# Patient Record
Sex: Female | Born: 1960 | Race: White | Hispanic: No | Marital: Single | Smoking: Never smoker
Health system: Southern US, Community
[De-identification: ages and names within clinical notes are randomized; demographics above are authoritative.]

## PROBLEM LIST (undated history)

## (undated) DIAGNOSIS — I471 Supraventricular tachycardia, unspecified: Secondary | ICD-10-CM

## (undated) HISTORY — PX: PARATHYROIDECTOMY: SHX19

## (undated) HISTORY — PX: BUNIONECTOMY: SHX129

## (undated) HISTORY — DX: Supraventricular tachycardia, unspecified: I47.10

## (undated) HISTORY — PX: NECK SURGERY: SHX720

## (undated) HISTORY — DX: Supraventricular tachycardia: I47.1

---

## 2010-07-21 ENCOUNTER — Encounter: Admission: RE | Admit: 2010-07-21 | Discharge: 2010-07-21 | Payer: Self-pay

## 2010-11-28 ENCOUNTER — Other Ambulatory Visit (HOSPITAL_COMMUNITY)
Admission: RE | Admit: 2010-11-28 | Discharge: 2010-11-28 | Disposition: A | Discharge status: Home / Self Care | Payer: BC Managed Care – PPO | Source: Ambulatory Visit | Attending: Internal Medicine | Admitting: Internal Medicine

## 2010-11-28 ENCOUNTER — Other Ambulatory Visit: Payer: Self-pay | Admitting: Internal Medicine

## 2010-11-28 DIAGNOSIS — Z01419 Encounter for gynecological examination (general) (routine) without abnormal findings: Secondary | ICD-10-CM | POA: Insufficient documentation

## 2010-12-23 ENCOUNTER — Ambulatory Visit
Admission: RE | Admit: 2010-12-23 | Discharge: 2010-12-23 | Disposition: A | Discharge status: Home / Self Care | Payer: BC Managed Care – PPO | Source: Ambulatory Visit | Attending: Internal Medicine | Admitting: Internal Medicine

## 2010-12-23 ENCOUNTER — Other Ambulatory Visit: Payer: Self-pay | Admitting: Internal Medicine

## 2010-12-23 MED ORDER — IOHEXOL 300 MG/ML  SOLN
100.0000 mL | Freq: Once | INTRAMUSCULAR | Status: AC | PRN
  Administered 2010-12-23: 100 mL via INTRAVENOUS

## 2011-04-14 ENCOUNTER — Encounter: Payer: Self-pay | Admitting: Cardiology

## 2011-05-23 ENCOUNTER — Encounter: Payer: Self-pay | Admitting: Internal Medicine

## 2011-05-23 ENCOUNTER — Ambulatory Visit (INDEPENDENT_AMBULATORY_CARE_PROVIDER_SITE_OTHER): Payer: BC Managed Care – PPO | Admitting: Internal Medicine

## 2011-05-23 VITALS — BP 94/54 | HR 68 | Resp 12 | Ht 66.0 in | Wt 134.0 lb

## 2011-05-23 DIAGNOSIS — R002 Palpitations: Secondary | ICD-10-CM

## 2011-05-23 DIAGNOSIS — I471 Supraventricular tachycardia: Secondary | ICD-10-CM

## 2011-05-23 NOTE — Assessment & Plan Note (Signed)
The patient's symptoms are a little bit unclear. The way she describes her spells coming on, I thought perhaps she had supraventricular tachycardia however she could have inappropriate sinus tachycardia. Her palpitations have improved on medical therapy.

## 2011-05-23 NOTE — Patient Instructions (Signed)
Your physician recommends that you schedule a follow-up appointment in: 3 months with Dr Taylor  

## 2011-05-23 NOTE — Assessment & Plan Note (Signed)
I have had a chance to review the patient's cardiac monitor, exercise electrocardiogram, and 2-D echo. Review of her cardiac monitor demonstrates that when she goes into tachycardia, her P-wave morphology we'll change and her PR interval actually shrink somewhat. This would not be consistent with inappropriate sinus tachycardia. More likely however would be atrial tachycardia originating from the right atrium. I have discussed the treatment options with the patient. She very clearly has improvement in her symptoms on beta blocker therapy. She does experience dizziness and lightheadedness particularly early in the morning.  My recommendation will be that she continue her beta blocker and undergo a period of watchful waiting. I reassured the patient. Because her blood pressure is low, I have asked that she increase her salt and fluid intake and avoid caffeine.

## 2011-05-23 NOTE — Progress Notes (Signed)
HPI Priscilla Miller is referred today by Dr. Jacinto Halim for evaluation of tachypalpitations and SVT. The patient is a 50 year old woman whose health has otherwise been good. Over the last several months, she has noted palpitations which have been variable in nature but will often awaken her from sleep. At other times she will experience them with exertion particularly after playing sports. She was seen by the emergency room physician in [place] and was thought to have anxiety. The patient notes that her regular monthly periods have begun to become irregular. She denies the use of any stimulants. After cardiology followup, a 2-D echo demonstrated preserved left ventricular systolic function and an exercise treadmill test demonstrated no inducible arrhythmias and no ST-T wave abnormalities. She subsequently wore a cardiac monitor. This demonstrates a long RP tachycardia. Interestingly the patient's tachycardia has a P-wave morphology which is similar to the P-wave morphology in sinus rhythm. There are however subtle differences. She has documented SVT at rates of up to 170 beats per minute. Also, the patient's tachycardia has a warming up. Where the heart rate will increase from about 100 beats per minute to nearly 170 beats per minute over the course of 10-20 seconds. This was nicely documented by her cardiac monitor. She has never had frank syncope. She was placed on a beta blocker and has had improvement of her symptoms but notes that her blood pressure has been running a little on the low side. No Known Allergies   Current Outpatient Prescriptions  Medication Sig Dispense Refill  . acebutolol (SECTRAL) 200 MG capsule Take 200 mg by mouth daily.        Marland Kitchen b complex vitamins tablet Take 1 tablet by mouth daily.        . Omega-3 Fatty Acids (FISH OIL BURP-LESS PO) Take 1 tablet by mouth daily.           No past medical history on file.  ROS:   All systems reviewed and negative except as noted in the  HPI.   No past surgical history on file.   No family history on file.   History   Social History  . Marital Status: Unknown    Spouse Name: N/A    Number of Children: N/A  . Years of Education: N/A   Occupational History  . Not on file.   Social History Main Topics  . Smoking status: Not on file  . Smokeless tobacco: Not on file  . Alcohol Use: Not on file  . Drug Use: Not on file  . Sexually Active: Not on file   Other Topics Concern  . Not on file   Social History Narrative  . No narrative on file     BP 94/54  Pulse 68  Resp 12  Ht 5\' 6"  (1.676 m)  Wt 134 lb (60.782 kg)  BMI 21.63 kg/m2  Physical Exam:  Well appearing middle-aged woman, NAD HEENT: Unremarkable Neck:  No JVD, no thyromegally Lymphatics:  No adenopathy Back:  No CVA tenderness Lungs:  Clear with no wheezes, rales, or rhonchi. HEART:  Regular rate rhythm, no murmurs, no rubs, no clicks Abd:  soft, positive bowel sounds, no organomegally, no rebound, no guarding Ext:  2 plus pulses, no edema, no cyanosis, no clubbing Skin:  No rashes no nodules Neuro:  CN II through XII intact, motor grossly intact  EKG Normal sinus rhythm  Assess/Plan:

## 2011-06-05 ENCOUNTER — Encounter: Payer: Self-pay | Admitting: Internal Medicine

## 2011-06-14 ENCOUNTER — Other Ambulatory Visit: Payer: Self-pay | Admitting: Internal Medicine

## 2011-06-14 DIAGNOSIS — Z1231 Encounter for screening mammogram for malignant neoplasm of breast: Secondary | ICD-10-CM

## 2011-08-02 ENCOUNTER — Ambulatory Visit
Admission: RE | Admit: 2011-08-02 | Discharge: 2011-08-02 | Disposition: A | Discharge status: Home / Self Care | Payer: BC Managed Care – PPO | Source: Ambulatory Visit | Attending: Internal Medicine | Admitting: Internal Medicine

## 2011-08-02 DIAGNOSIS — Z1231 Encounter for screening mammogram for malignant neoplasm of breast: Secondary | ICD-10-CM

## 2011-08-15 ENCOUNTER — Ambulatory Visit: Payer: BC Managed Care – PPO | Admitting: Internal Medicine

## 2012-03-31 IMAGING — MG MM DIGITAL SCREENING BILAT W/ CAD
5 series · 5 of 5 positions shown · non-contrast
Comparison: none

DG SCREEN MAMMOGRAM BILATERAL
Bilateral CC and MLO view(s) were taken.

DIGITAL SCREENING MAMMOGRAM WITH CAD:
The breast tissue is heterogeneously dense.  No masses or malignant type calcifications are 
identified.
Images were processed with CAD.

[R CC]
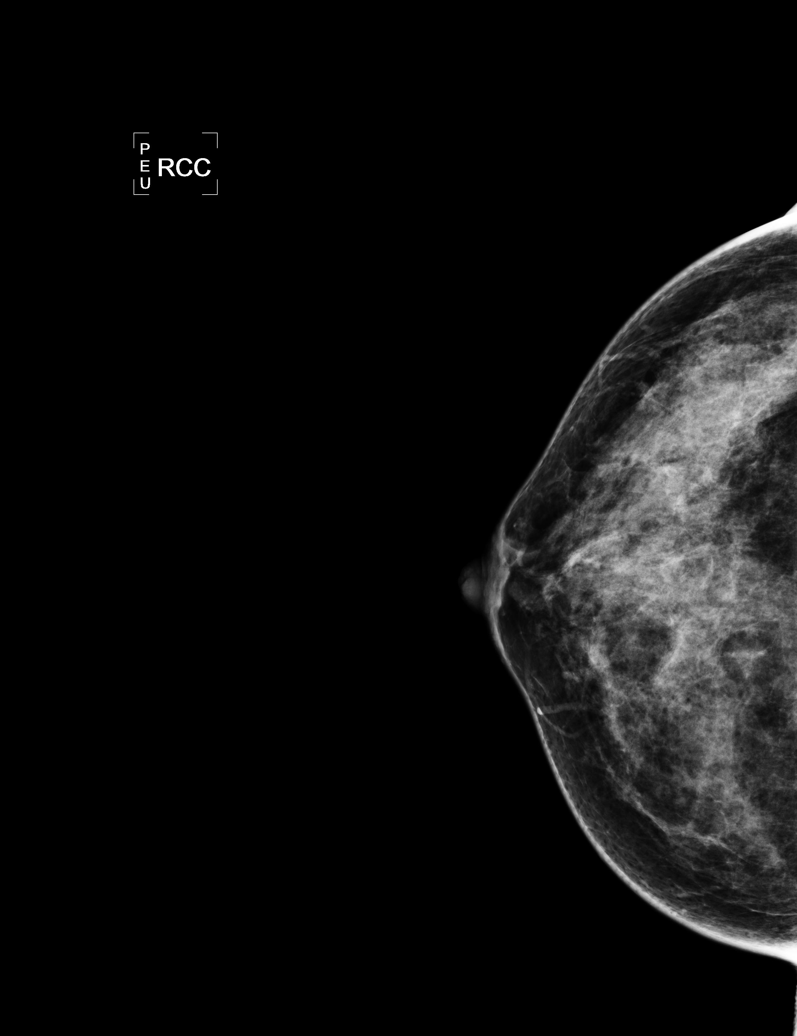

[L CC]
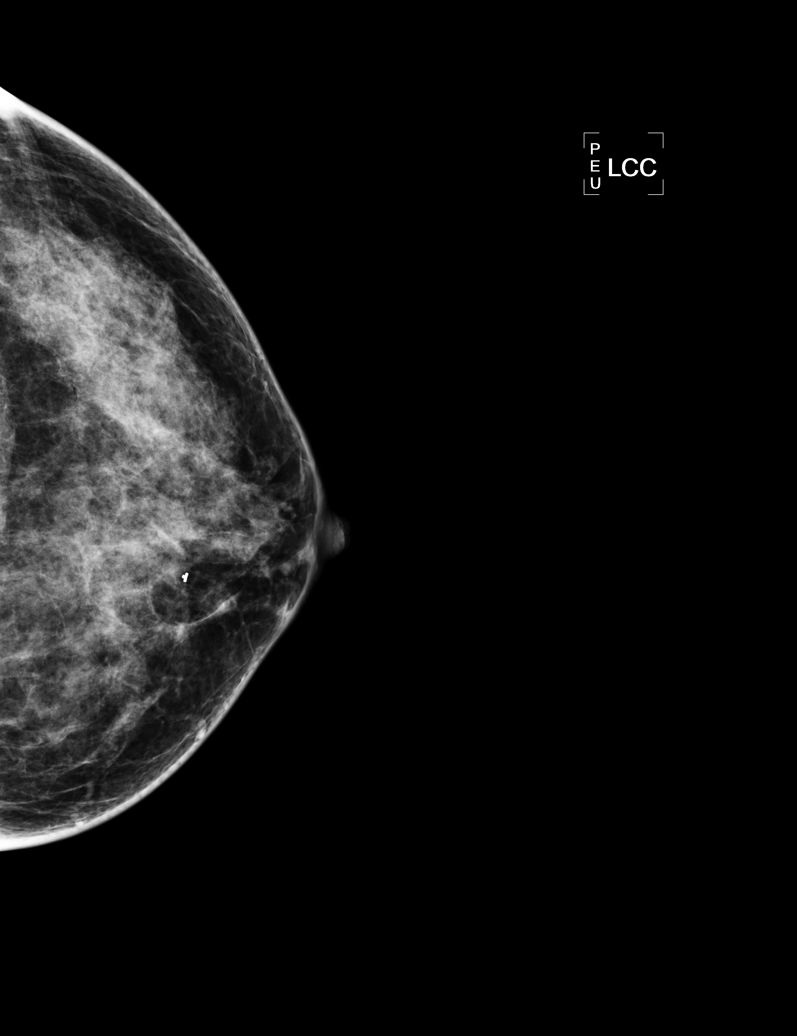

[L MLO]
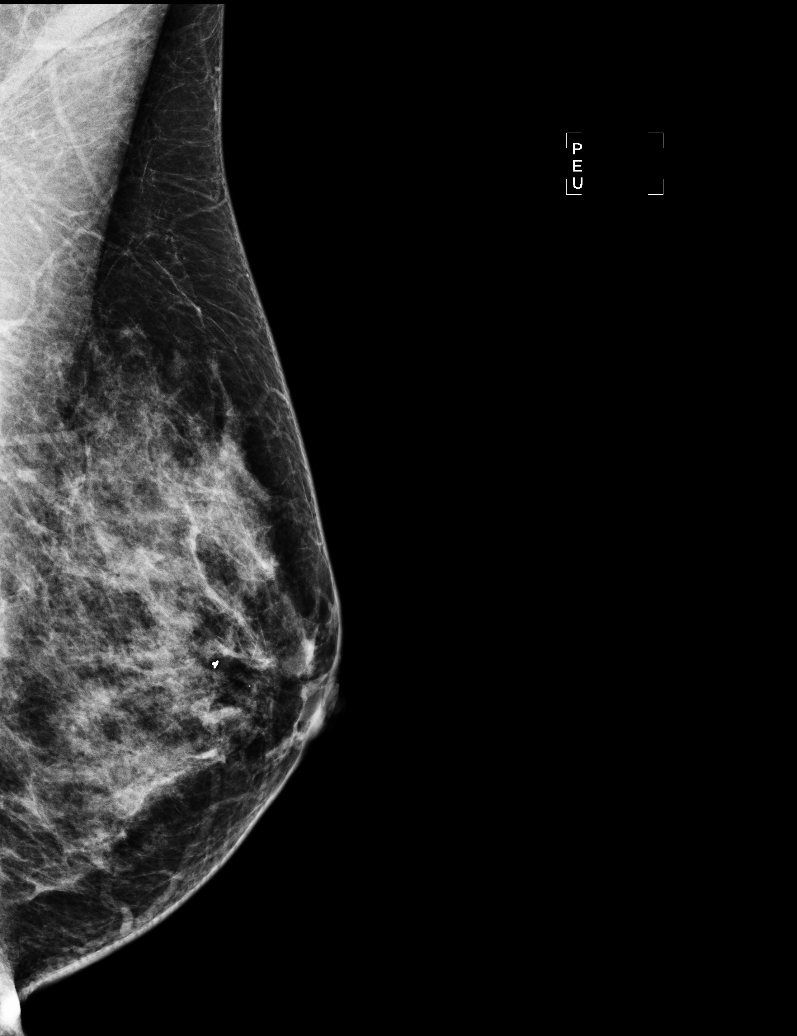

[R MLO (1 of 2)]
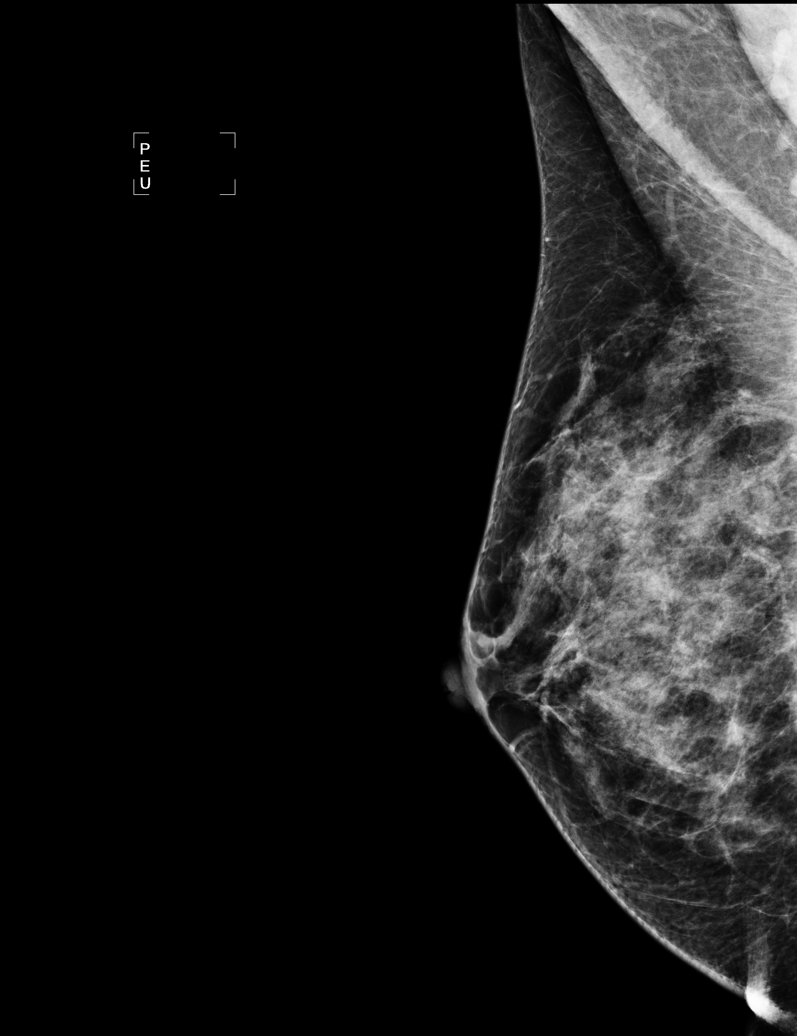

[R MLO (2 of 2)]
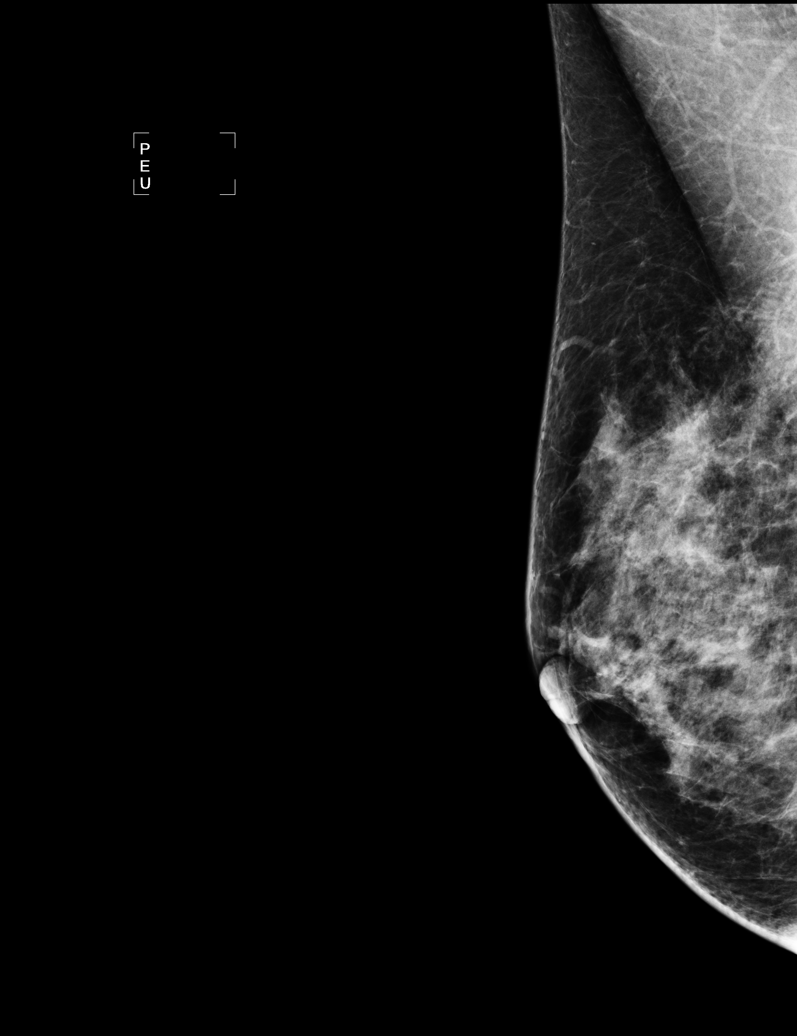

[5 of 5 positions shown; findings below may reference images not displayed]

IMPRESSION: No specific mammographic evidence of malignancy.  Next screening mammogram is recommended in one 
year.

A result letter of this screening mammogram will be mailed directly to the patient.

ASSESSMENT: Negative - BI-RADS 1

Screening mammogram in 1 year.
,

## 2012-06-25 ENCOUNTER — Other Ambulatory Visit: Payer: Self-pay | Admitting: Gynecology

## 2012-06-25 DIAGNOSIS — Z1231 Encounter for screening mammogram for malignant neoplasm of breast: Secondary | ICD-10-CM

## 2012-08-07 ENCOUNTER — Ambulatory Visit: Payer: BC Managed Care – PPO

## 2012-09-13 ENCOUNTER — Ambulatory Visit
Admission: RE | Admit: 2012-09-13 | Discharge: 2012-09-13 | Disposition: A | Discharge status: Home / Self Care | Payer: BC Managed Care – PPO | Source: Ambulatory Visit | Attending: Gynecology | Admitting: Gynecology

## 2012-09-13 DIAGNOSIS — Z1231 Encounter for screening mammogram for malignant neoplasm of breast: Secondary | ICD-10-CM

## 2013-08-12 ENCOUNTER — Other Ambulatory Visit: Payer: Self-pay | Admitting: Gynecology

## 2013-12-15 ENCOUNTER — Encounter: Payer: Self-pay | Admitting: Internal Medicine

## 2013-12-29 ENCOUNTER — Telehealth: Payer: Self-pay | Admitting: Internal Medicine

## 2013-12-29 NOTE — Telephone Encounter (Signed)
Spoke with patient. Patient concerned about being awake during procedure. She states that she wants to be "out". Explained deep sedation/propofol. Also patient concerned about her hx of heart palpitation/SVT since 2012, patient states she is going threw menopause and is not being treated for heart at this time.  Encouraged patient to talk with CRNA about this. Patient verbalizes understanding and had no further questions/concerns at this time.

## 2014-01-29 ENCOUNTER — Ambulatory Visit (AMBULATORY_SURGERY_CENTER): Payer: Self-pay

## 2014-01-29 VITALS — Ht 65.0 in | Wt 137.0 lb

## 2014-01-29 DIAGNOSIS — Z1211 Encounter for screening for malignant neoplasm of colon: Secondary | ICD-10-CM

## 2014-01-29 MED ORDER — MOVIPREP 100 G PO SOLR: 1.0000 | Freq: Once | ORAL | Status: DC

## 2014-01-29 NOTE — Progress Notes (Signed)
No allergies to eggs or soy No home oxygen No diet/weight loss medicine No past problems with anesthesia Has email  Emmi instructions given for colonoscopy

## 2014-02-02 ENCOUNTER — Encounter: Payer: Self-pay | Admitting: Internal Medicine

## 2014-02-13 ENCOUNTER — Ambulatory Visit (AMBULATORY_SURGERY_CENTER): Payer: BC Managed Care – PPO | Admitting: Internal Medicine

## 2014-02-13 ENCOUNTER — Encounter: Payer: Self-pay | Admitting: Internal Medicine

## 2014-02-13 VITALS — BP 110/70 | HR 69 | Temp 97.7°F | Resp 17 | Ht 65.0 in | Wt 137.0 lb

## 2014-02-13 DIAGNOSIS — Z1211 Encounter for screening for malignant neoplasm of colon: Secondary | ICD-10-CM

## 2014-02-13 DIAGNOSIS — D126 Benign neoplasm of colon, unspecified: Secondary | ICD-10-CM

## 2014-02-13 MED ORDER — SODIUM CHLORIDE 0.9 % IV SOLN: 500.0000 mL | INTRAVENOUS | Status: DC

## 2014-02-13 NOTE — Progress Notes (Signed)
Called to room to assist during endoscopic procedure.  Patient ID and intended procedure confirmed with present staff. Received instructions for my participation in the procedure from the performing physician.  

## 2014-02-13 NOTE — Progress Notes (Signed)
Pt anxious and tearful in the admitting area

## 2014-02-13 NOTE — Progress Notes (Signed)
A/ox3 pleased with MAC, report to [facility]

## 2014-02-13 NOTE — Op Note (Signed)
[place]  [address] [place], [address]   COLONOSCOPY PROCEDURE REPORT  PATIENT: Priscilla Miller, Priscilla Miller  MR#: [id number] BIRTHDATE: 03/29/1961 , 53  yrs. old GENDER: Female ENDOSCOPIST: Lafayette Dragon, MD REFERRED WK:[id number] Ashby Dawes, M.D. PROCEDURE DATE:  02/13/2014 PROCEDURE:   Colonoscopy with cold biopsy polypectomy First Screening Colonoscopy - Avg.  risk and is 50 yrs.  old or older Yes.  Prior Negative Screening - Now for repeat screening. N/A  History of Adenoma - Now for follow-up colonoscopy & has been > or = to 3 yrs.  N/A  Polyps Removed Today? Yes. ASA CLASS:   Class I INDICATIONS:Average risk patient for colon cancer. MEDICATIONS: MAC sedation, administered by CRNA and propofol (Diprivan) 300mg  IV  DESCRIPTION OF PROCEDURE:   After the risks benefits and alternatives of the procedure were thoroughly explained, informed consent was obtained.  A digital rectal exam revealed no abnormalities of the rectum.   The LB [id number]  endoscope was introduced through the anus and advanced to the cecum, which was identified by both the appendix and ileocecal valve. No adverse events experienced.   The quality of the prep was excellent, using MoviPrep  The instrument was then slowly withdrawn as the colon was fully examined.      COLON FINDINGS: A sessile polyp was found in the ascending colon.  A polypectomy was performed with cold forceps.  The resection was complete and the polyp tissue was completely retrieved. Retroflexed views revealed no abnormalities. The time to cecum=4 minutes 420 seconds.  Withdrawal time=6 minutes 10 seconds.  The scope was withdrawn and the procedure completed. COMPLICATIONS: There were no complications.  ENDOSCOPIC IMPRESSION: Sessile polyp was found in the ascending colon; polypectomy was performed with cold forceps  RECOMMENDATIONS: 1.  Await pathology results 2.  high fiber diet Recall colonoscopy pending path  report   eSigned:  Lafayette Dragon, MD 02/13/2014 11:28 AM   cc:

## 2014-02-13 NOTE — Patient Instructions (Signed)
YOU HAD AN ENDOSCOPIC PROCEDURE TODAY AT THE [facility]: Refer to the procedure report that was given to you for any specific questions about what was found during the examination.  If the procedure report does not answer your questions, please call your gastroenterologist to clarify.  If you requested that your care partner not be given the details of your procedure findings, then the procedure report has been included in a sealed envelope for you to review at your convenience later.  YOU SHOULD EXPECT: Some feelings of bloating in the abdomen. Passage of more gas than usual.  Walking can help get rid of the air that was put into your GI tract during the procedure and reduce the bloating. If you had a lower endoscopy (such as a colonoscopy or flexible sigmoidoscopy) you may notice spotting of blood in your stool or on the toilet paper. If you underwent a bowel prep for your procedure, then you may not have a normal bowel movement for a few days.  DIET: Your first meal following the procedure should be a light meal and then it is ok to progress to your normal diet.  A half-sandwich or bowl of soup is an example of a good first meal.  Heavy or fried foods are harder to digest and may make you feel nauseous or bloated.  Likewise meals heavy in dairy and vegetables can cause extra gas to form and this can also increase the bloating.  Drink plenty of fluids but you should avoid alcoholic beverages for 24 hours.  ACTIVITY: Your care partner should take you home directly after the procedure.  You should plan to take it easy, moving slowly for the rest of the day.  You can resume normal activity the day after the procedure however you should NOT DRIVE or use heavy machinery for 24 hours (because of the sedation medicines used during the test).    SYMPTOMS TO REPORT IMMEDIATELY: A gastroenterologist can be reached at any hour.  During normal business hours, 8:30 AM to 5:00 PM Monday through Friday,  call [phone].  After hours and on weekends, please call the GI answering service at [phone] who will take a message and have the physician on call contact you.   Following lower endoscopy (colonoscopy or flexible sigmoidoscopy):  Excessive amounts of blood in the stool  Significant tenderness or worsening of abdominal pains  Swelling of the abdomen that is new, acute  Fever of 100F or higher  FOLLOW UP: If any biopsies were taken you will be contacted by phone or by letter within the next 1-3 weeks.  Call your gastroenterologist if you have not heard about the biopsies in 3 weeks.  Our staff will call the home number listed on your records the next business day following your procedure to check on you and address any questions or concerns that you may have at that time regarding the information given to you following your procedure. This is a courtesy call and so if there is no answer at the home number and we have not heard from you through the emergency physician on call, we will assume that you have returned to your regular daily activities without incident.  SIGNATURES/CONFIDENTIALITY: You and/or your care partner have signed paperwork which will be entered into your electronic medical record.  These signatures attest to the fact that that the information above on your After Visit Summary has been reviewed and is understood.  Full responsibility of the confidentiality of this   discharge information lies with you and/or your care-partner.  Polyp and high fiber diet information given.

## 2014-02-14 ENCOUNTER — Telehealth: Payer: Self-pay | Admitting: Gastroenterology

## 2014-02-14 NOTE — Telephone Encounter (Signed)
Developed mild but persistant RLQ pain today. Having BMs, no temp. Instructed to call back if pain worsens.

## 2014-02-16 ENCOUNTER — Telehealth: Payer: Self-pay | Admitting: *Deleted

## 2014-02-16 NOTE — Telephone Encounter (Signed)
  Follow up Call-  Call back number 02/13/2014  Post procedure Call Back phone  # [phone]  Permission to leave phone message Yes     Patient questions:  Left message to call us if necessary.

## 2014-02-17 ENCOUNTER — Encounter: Payer: Self-pay | Admitting: Internal Medicine

## 2014-02-18 ENCOUNTER — Encounter: Payer: Self-pay | Admitting: *Deleted

## 2014-02-23 ENCOUNTER — Telehealth: Payer: Self-pay | Admitting: Internal Medicine

## 2014-02-23 NOTE — Telephone Encounter (Signed)
Left a message for patient to call back. 

## 2014-02-23 NOTE — Telephone Encounter (Signed)
Spoke with patient and discussed results and recommendations as per her letter.

## 2014-08-11 ENCOUNTER — Other Ambulatory Visit: Payer: Self-pay | Admitting: Obstetrics & Gynecology

## 2014-08-13 LAB — CYTOLOGY - PAP

## 2015-11-11 ENCOUNTER — Other Ambulatory Visit: Payer: Self-pay | Admitting: Obstetrics & Gynecology

## 2015-11-11 DIAGNOSIS — R928 Other abnormal and inconclusive findings on diagnostic imaging of breast: Secondary | ICD-10-CM

## 2015-11-23 ENCOUNTER — Ambulatory Visit
Admission: RE | Admit: 2015-11-23 | Discharge: 2015-11-23 | Disposition: A | Discharge status: Home / Self Care | Payer: BLUE CROSS/BLUE SHIELD | Source: Ambulatory Visit | Attending: Obstetrics & Gynecology | Admitting: Obstetrics & Gynecology

## 2015-11-23 DIAGNOSIS — R928 Other abnormal and inconclusive findings on diagnostic imaging of breast: Secondary | ICD-10-CM

## 2017-04-30 ENCOUNTER — Other Ambulatory Visit: Payer: Self-pay | Admitting: Family Medicine

## 2017-04-30 ENCOUNTER — Ambulatory Visit
Admission: RE | Admit: 2017-04-30 | Discharge: 2017-04-30 | Disposition: A | Discharge status: Home / Self Care | Payer: BLUE CROSS/BLUE SHIELD | Source: Ambulatory Visit | Attending: Family Medicine | Admitting: Family Medicine

## 2017-04-30 DIAGNOSIS — R059 Cough, unspecified: Secondary | ICD-10-CM

## 2017-04-30 DIAGNOSIS — R05 Cough: Secondary | ICD-10-CM

## 2017-09-03 ENCOUNTER — Encounter (HOSPITAL_COMMUNITY): Payer: Self-pay | Admitting: Emergency Medicine

## 2017-09-03 ENCOUNTER — Emergency Department (HOSPITAL_COMMUNITY)
Admission: EM | Admit: 2017-09-03 | Discharge: 2017-09-03 | Disposition: A | Discharge status: Home / Self Care | Payer: BLUE CROSS/BLUE SHIELD | Attending: Emergency Medicine | Admitting: Emergency Medicine

## 2017-09-03 ENCOUNTER — Emergency Department (HOSPITAL_COMMUNITY): Payer: BLUE CROSS/BLUE SHIELD

## 2017-09-03 ENCOUNTER — Other Ambulatory Visit: Payer: Self-pay

## 2017-09-03 DIAGNOSIS — Z79899 Other long term (current) drug therapy: Secondary | ICD-10-CM | POA: Diagnosis not present

## 2017-09-03 DIAGNOSIS — R002 Palpitations: Secondary | ICD-10-CM

## 2017-09-03 DIAGNOSIS — I471 Supraventricular tachycardia: Secondary | ICD-10-CM | POA: Diagnosis not present

## 2017-09-03 LAB — CBC
HEMATOCRIT: 36.4 % (ref 36.0–46.0)
HEMOGLOBIN: 11.8 g/dL — AB (ref 12.0–15.0)
MCH: 27.6 pg (ref 26.0–34.0)
MCHC: 32.4 g/dL (ref 30.0–36.0)
MCV: 85.2 fL (ref 78.0–100.0)
Platelets: 244 10*3/uL (ref 150–400)
RBC: 4.27 MIL/uL (ref 3.87–5.11)
RDW: 14 % (ref 11.5–15.5)
WBC: 5.8 10*3/uL (ref 4.0–10.5)

## 2017-09-03 LAB — BASIC METABOLIC PANEL
ANION GAP: 9 (ref 5–15)
BUN: 12 mg/dL (ref 6–20)
CO2: 25 mmol/L (ref 22–32)
Calcium: 9.4 mg/dL (ref 8.9–10.3)
Chloride: 103 mmol/L (ref 101–111)
Creatinine, Ser: 0.69 mg/dL (ref 0.44–1.00)
GFR calc Af Amer: >60 mL/min (ref >60)
Glucose, Bld: 100 mg/dL — ABNORMAL HIGH (ref 65–99)
POTASSIUM: 3.9 mmol/L (ref 3.5–5.1)
SODIUM: 137 mmol/L (ref 135–145)

## 2017-09-03 LAB — TSH: TSH: 4.455 u[IU]/mL (ref 0.350–4.500)

## 2017-09-03 LAB — I-STAT TROPONIN, ED: Troponin i, poc: 0 ng/mL (ref 0.00–0.08)

## 2017-09-03 LAB — MAGNESIUM: MAGNESIUM: 1.9 mg/dL (ref 1.7–2.4)

## 2017-09-03 NOTE — ED Provider Notes (Addendum)
TIME SEEN: 4:23 AM  CHIEF COMPLAINT: Palpitations  HPI: Patient is a 56 year old female with history of previous palpitations who presents to the emergency department today with palpitations that woke her from sleep just prior to arrival.  States that she felt like her heart was racing and felt like the rate was regular.  States it lasted 30 minutes and then resolved.  She tried vagal maneuvers without much relief.  She states she has had a history of hyperparathyroidism and had to have surgery 2 years ago.  She states she is previously had palpitations and was told it was either SVT or sinus tachycardia after wearing a cardiac monitor for 30 days.  She states with this episode she does feel like she is gasping for breath but no chest pain, nausea, vomiting, dizziness, diaphoresis.  Afterwards she states she does feel tingly all over and that her head feels "fuzzy".  She denies recent fevers, cough, vomiting or diarrhea.  States previous to this she was feeling well.  No recent stimulant use.  No change in her caffeine intake.  No illicit drug use.  Has been under a lot of stress recently.  States her cousin recently died and she also lost her dog.   ROS: See HPI Constitutional: no fever  Eyes: no drainage  ENT: no runny nose   Cardiovascular:  no chest pain  Resp: no SOB  GI: no vomiting GU: no dysuria Integumentary: no rash  Allergy: no hives  Musculoskeletal: no leg swelling  Neurological: no slurred speech ROS otherwise negative  PAST MEDICAL HISTORY/PAST SURGICAL HISTORY:  Past Medical History:  Diagnosis Date  . Paroxysmal SVT (supraventricular tachycardia) (HCC)     MEDICATIONS:  Prior to Admission medications   Medication Sig Start Date End Date Taking? Authorizing Provider  b complex vitamins tablet Take 1 tablet by mouth daily.      [provider]  Omega-3 Fatty Acids (FISH OIL BURP-LESS PO) Take 1 tablet by mouth daily.      [provider]  OVER THE  COUNTER MEDICATION Woman's 40+ whole food multivitamin    [provider]    ALLERGIES:  No Known Allergies  SOCIAL HISTORY:  Social History   Tobacco Use  . Smoking status: Never Smoker  . Smokeless tobacco: Never Used  Substance Use Topics  . Alcohol use: Yes    Alcohol/week: 0.6 oz    Types: 1 Cans of beer per week    FAMILY HISTORY: Family History  Problem Relation Age of Onset  . Colon cancer Neg Hx   . Pancreatic cancer Neg Hx   . Rectal cancer Neg Hx   . Stomach cancer Neg Hx     EXAM: BP 121/87   Pulse 76   Temp 97.6 F (36.4 C) (Oral)   Resp 13   Ht 5\' 5"  (1.651 m)   Wt 59 kg (130 lb)   LMP 11/09/2015   SpO2 100%   BMI 21.63 kg/m  CONSTITUTIONAL: Alert and oriented and responds appropriately to questions. Well-appearing; well-nourished HEAD: Normocephalic EYES: Conjunctivae clear, pupils appear equal, EOMI ENT: normal nose; moist mucous membranes NECK: Supple, no meningismus, no nuchal rigidity, no LAD  CARD: RRR; S1 and S2 appreciated; no murmurs, no clicks, no rubs, no gallops RESP: Normal chest excursion without splinting or tachypnea; breath sounds clear and equal bilaterally; no wheezes, no rhonchi, no rales, no hypoxia or respiratory distress, speaking full sentences ABD/GI: Normal bowel sounds; non-distended; soft, non-tender, no rebound, no guarding,  no peritoneal signs, no hepatosplenomegaly BACK:  The back appears normal and is non-tender to palpation, there is no CVA tenderness EXT: Normal ROM in all joints; non-tender to palpation; no edema; normal capillary refill; no cyanosis, no calf tenderness or swelling    SKIN: Normal color for age and race; warm; no rash NEURO: Moves all extremities equally PSYCH: The patient's mood and manner are appropriate. Grooming and personal hygiene are appropriate.  MEDICAL DECISION MAKING: Patient here with palpitations.  Symptoms have now resolved.  She is in a normal sinus rhythm without ischemic  abnormalities or interval changes.  Will check blood counts, electrolytes, TSH, troponin.  Chest x-ray obtained in triage is unremarkable.  We will continue to monitor her closely on a cardiac monitor.  Unclear what rhythm patient was in.  She states that she does not think she has been definitively told that she has had SVT before although this is listed in her chart as a previous diagnosis from a cardiologist with [facility], Dr. Janeece Riggers.  It appears per that note they thought that she had SVT versus atrial tachycardia versus inappropriate sinus tachycardia.  They recommended an EP study but patient declined due to risks.  ED PROGRESS: Patient's labs are unremarkable.  Hemoglobin 11.8.  Potassium 3.9, calcium 9.4, magnesium 1.9.  Negative troponin.  Normal TSH.  Patient still asymptomatic in a normal rhythm.  Chest x-ray clear.  I feel she is safe to be discharged home.  Will discharge with outpatient cardiology follow-up.  At this time, I do not feel there is any life-threatening condition present. I have reviewed and discussed all results (EKG, imaging, lab, urine as appropriate) and exam findings with patient/family. I have reviewed nursing notes and appropriate previous records.  I feel the patient is safe to be discharged home without further emergent workup and can continue workup as an outpatient as needed. Discussed usual and customary return precautions. Patient/family verbalize understanding and are comfortable with this plan.  Outpatient follow-up has been provided if needed. All questions have been answered.     EKG Interpretation  Date/Time:  Monday September 03 2017 03:18:21 EST Ventricular Rate:  81 PR Interval:  144 QRS Duration: 90 QT Interval:  384 QTC Calculation: 446 R Axis:   78 Text Interpretation:  Normal sinus rhythm Nonspecific ST abnormality Abnormal ECG No old tracing to compare Confirmed by Perry Brucato, Cyril Mourning [phone]) on 09/03/2017 4:23:35 AM          Lorra Freeman, Delice Bison, DO 09/03/17 0620    Davontay Watlington, Delice Bison, DO 09/03/17 [phone]

## 2017-09-03 NOTE — ED Triage Notes (Signed)
Pt states while sleeping she felt heart racing, pt states she can usually control HR with vagal maneuvers but was unable to tonight. Pt states since parathyroid surgery rapid HR usually is d/t high or low Calcium. Pt states HR has now converted and she just feels "tingly". Pt reports some shob with episodes.

## 2017-09-03 NOTE — ED Notes (Addendum)
Pt reports waking up and feeling her heart racing. Pt reports having her parathyroid removed and now having problems with her calcium. Pt reports her hr problems are associated with her calcium.

## 2017-09-05 ENCOUNTER — Encounter (INDEPENDENT_AMBULATORY_CARE_PROVIDER_SITE_OTHER): Payer: Self-pay

## 2017-09-05 ENCOUNTER — Encounter: Payer: Self-pay | Admitting: Internal Medicine

## 2017-09-05 ENCOUNTER — Ambulatory Visit: Payer: BLUE CROSS/BLUE SHIELD | Admitting: Internal Medicine

## 2017-09-05 VITALS — BP 110/64 | HR 66 | Ht 65.0 in | Wt 133.4 lb

## 2017-09-05 DIAGNOSIS — I471 Supraventricular tachycardia: Secondary | ICD-10-CM | POA: Diagnosis not present

## 2017-09-05 MED ORDER — PROPRANOLOL HCL 10 MG PO TABS: 10.0000 mg | ORAL_TABLET | Freq: Four times a day (QID) | ORAL | 1 refills | Status: AC | PRN

## 2017-09-05 NOTE — Progress Notes (Signed)
ELECTROPHYSIOLOGY CONSULT NOTE  Patient ID: Priscilla Miller, MRN: [id number], DOB/AGE: Aug 10, 1961 57 y.o. Admit date: (Not on file) Date of Consult: 09/05/2017  Primary Physician: Fanny Bien, MD Primary Cardiologist: new     Priscilla Miller is a 57 y.o. female who is being seen today for the evaluation of tachycardia  at the request of herself?ER.    HPI Priscilla Miller is a 57 y.o. female  With a long-standing history of recurrent tachypalpitations.  There are 2 distinct symptom complexes, the first is rapid and regular; the other is also rapid irregular but is characterized by pounding.  She has hx of SVT for which she saw Dr Elliot Cousin-- long RP Event Recorder personnally reviewed p wave morphology similar to sinus and with warm up.  She was seen both here and at [facility] with some consideration of catheter ablation.  She was disinclined that there is no clear diagnosis.  Episodes are relatively infrequent until about 2-3 weeks ago following the death of her beloved dog [place].  She has had 3 or 4 episodes and has ended up in the emergency room on one occasion.  EMS was called on another occasion.  In each visit heart rhythm documentation was normal  In the past therapies have been attempted with atenolol which resulted in profound fatigue.  She states it may have been a different beta-blocker as well.  Her past medical history is also notable for  hyperparathyroidism for which she underwent parathyroidectomy March 2017.  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   a recent calcium was found to be mildly elevated most recent one was normal.  It was her impression that her symptoms in the past had been able to be mitigated by taking a calcium tablet.  Hence, she thought that it was related to her hyperparathyroidism.  She is also concerned about hyperparathyroidism effect on her heart.  GXT showed normal HR acceleration      Past Medical History:  Diagnosis Date  . Paroxysmal SVT  (supraventricular tachycardia) [facility])       Surgical History:  Past Surgical History:  Procedure Laterality Date  . BUNIONECTOMY     bilat 2001  . NECK SURGERY     1994 mass removed from  . PARATHYROIDECTOMY       Home Meds: Prior to Admission medications   Not on File    Allergies: No Known Allergies  Social History   Socioeconomic History  . Marital status: Unknown    Spouse name: Not on file  . Number of children: Not on file  . Years of education: Not on file  . Highest education level: Not on file  Social Needs  . Financial resource strain: Not on file  . Food insecurity - worry: Not on file  . Food insecurity - inability: Not on file  . Transportation needs - medical: Not on file  . Transportation needs - non-medical: Not on file  Occupational History  . Not on file  Tobacco Use  . Smoking status: Never Smoker  . Smokeless tobacco: Never Used  Substance and Sexual Activity  . Alcohol use: Yes    Alcohol/week: 0.6 oz    Types: 1 Cans of beer per week  . Drug use: No  . Sexual activity: Not on file  Other Topics Concern  . Not on file  Social History Narrative  . Not on file     Family History  Problem Relation Age of Onset  . Colon  cancer Neg Hx   . Pancreatic cancer Neg Hx   . Rectal cancer Neg Hx   . Stomach cancer Neg Hx      ROS:  Please see the history of present illness.     All other systems reviewed and negative.    Physical Exam: Blood pressure 110/64, pulse 66, height 5\' 5"  (1.651 m), weight 133 lb 6.4 oz (60.5 kg), last menstrual period 11/09/2015, SpO2 100 %. General: Well developed, well nourished female in no acute distress. Head: Normocephalic, atraumatic, sclera non-icteric, no xanthomas, nares are without discharge. EENT: normal  Lymph Nodes:  none Neck: Negative for carotid bruits. JVD not elevated. Back:without scoliosis kyphosis  Lungs: Clear bilaterally to auscultation without wheezes, rales, or rhonchi. Breathing is  unlabored. Heart: RRR with S1 S2. No murmur . No rubs, or gallops appreciated. Abdomen: Soft, non-tender, non-distended with normoactive bowel sounds. No hepatomegaly. No rebound/guarding. No obvious abdominal masses. Msk:  Strength and tone appear normal for age. Extremities: No clubbing or cyanosis. No edema.  Distal pedal pulses are 2+ and equal bilaterally. Skin: Warm and Dry Neuro: Alert and oriented X 3. CN III-XII intact Grossly normal sensory and motor function . Psych:  Responds to questions appropriately with a normal affect.      Labs: Cardiac Enzymes No results for input(s): CKTOTAL, CKMB, TROPONINI in the last 72 hours. CBC Lab Results  Component Value Date   WBC 5.8 09/03/2017   HGB 11.8 (L) 09/03/2017   HCT 36.4 09/03/2017   MCV 85.2 09/03/2017   PLT 244 09/03/2017   PROTIME: No results for input(s): LABPROT, INR in the last 72 hours. Chemistry  Recent Labs  Lab 09/03/17 0404  NA 137  K 3.9  CL 103  CO2 25  BUN 12  CREATININE 0.69  CALCIUM 9.4  GLUCOSE 100*   Lipids No results found for: CHOL, HDL, LDLCALC, TRIG BNP No results found for: PROBNP Thyroid Function Tests: Recent Labs    09/03/17 0443  TSH 4.455   Miscellaneous No results found for: DDIMER  Radiology/Studies:  Dg Chest 2 View  Result Date: 09/03/2017 CLINICAL DATA:  Acute onset of palpitations. EXAM: CHEST  2 VIEW COMPARISON:  Chest radiograph performed 04/30/2017 FINDINGS: The lungs are well-aerated and clear. There is no evidence of focal opacification, pleural effusion or pneumothorax. The heart is normal in size; the mediastinal contour is within normal limits. No acute osseous abnormalities are seen. IMPRESSION: No acute cardiopulmonary process seen. Electronically Signed   By: Garald Balding M.D.   On: 09/03/2017 04:45    EKG: Dated 09/03/2017 demonstrates sinus rhythm at 81 Intervals 14/09/38   Assessment and Plan:  Tachypalpitations  Long RP tachycardia with  continuous acceleration  Treated hyperparathyroidism (status post parathyroidectomy)     The patient has recurrent tachypalpitations.  In the past, and these are similar in symptoms, a long RP tachycardia that was continuous and without a distinction P wave morphology was recorded.  This would suggest a sinus tachycardia-inappropriate or atrial tachycardia.  Her symptom complex is much more episodic than I would have anticipated for inappropriate sinus tachycardia and thus wonder whether about atrial tachycardia and/or exuberant response to endogenous catecholamines related to stress as in the recent death of her dog which is preceded the most recent flurry of episodes  We will use AliveCor monitor to try to clarify the mechanisms of these events and to make sure there is nothing to suggest atrial fibrillation.  She also has concern about coronary  disease in the context of her prior hyperparathyroidism.  I have discussed with Dr. Meda Coffee; we will undertake calcium scoring.  In the event that this is abnormal, we would undertake CT angiography with FFR.      Virl Axe

## 2017-09-05 NOTE — Patient Instructions (Addendum)
Medication Instructions: Your physician has recommended you make the following change in your medication:  -1) START Inderal (Propranolol) 10 mg - Take 1 tablet (10 mg) by mouth every 6 hours as needed for fast heart rates - NEW RX SENT   Labwork: Your physician has recommended that you have lab work today: Ionized Calcium Level   Procedures/Testing: Your physician has requested that you have cardiac CT. Cardiac computed tomography (CT) is a painless test that uses an x-ray machine to take clear, detailed pictures of your heart. For further information please visit HugeFiesta.tn. Please follow instruction sheet as given.   Follow-Up:  Your physician recommends that you schedule a follow-up appointment in 12 weeks with Dr. Caryl Comes  Any Additional Special Instructions Will Be Listed Below (If Applicable).  AliveCor  FDA-cleared EKG at your fingertips. - [employer]   Agricultural engineer, [employer]. [email or web address]   FDA-cleared, clinical grade mobile EKG monitor: Jodelle Red is the most clinically-validated mobile EKG used by the world's leading cardiac care medical professionals.  This may be useful in monitoring palpitations.  We do not have access to have them emailed and reviewed but will be glad to review while in the office.      If you need a refill on your cardiac medications before your next appointment, please call your pharmacy.

## 2017-09-06 LAB — CALCIUM, IONIZED: Calcium, Ion: 5.2 mg/dL (ref 4.5–5.6)

## 2017-09-12 ENCOUNTER — Telehealth: Payer: Self-pay | Admitting: Internal Medicine

## 2017-09-12 NOTE — Telephone Encounter (Signed)
Left message letting pt know we are waiting for Dr. Caryl Comes to review results and we will call once received.  Advised to call back if any questions.

## 2017-09-12 NOTE — Telephone Encounter (Signed)
Priscilla Miller is calling to get test results. Please call .Marland Kitchen Thanks

## 2017-09-19 ENCOUNTER — Encounter: Payer: Self-pay | Admitting: Internal Medicine

## 2017-10-01 ENCOUNTER — Ambulatory Visit: Payer: BLUE CROSS/BLUE SHIELD | Admitting: Endocrinology

## 2017-10-09 ENCOUNTER — Other Ambulatory Visit: Payer: BLUE CROSS/BLUE SHIELD

## 2017-10-17 ENCOUNTER — Ambulatory Visit: Payer: BLUE CROSS/BLUE SHIELD | Admitting: Endocrinology

## 2017-10-17 ENCOUNTER — Encounter: Payer: Self-pay | Admitting: Endocrinology

## 2017-10-17 VITALS — BP 98/58 | Ht 65.0 in | Wt 131.4 lb

## 2017-10-17 DIAGNOSIS — E213 Hyperparathyroidism, unspecified: Secondary | ICD-10-CM

## 2017-10-17 DIAGNOSIS — M810 Age-related osteoporosis without current pathological fracture: Secondary | ICD-10-CM

## 2017-10-17 NOTE — Progress Notes (Signed)
Patient ID: Priscilla Miller, female   DOB: 11/01/1960, 57 y.o.   MRN: [id number]           Chief complaint: High calcium  History of Present Illness:  Referring physician: Linda Hedges    She has had a history of hypercalcemia probably starting around 2012 or so but no details are available She apparently had calcium levels in the upper normal range around 10.40 so for several years and was followed by her PCP with observation alone She did not have any history of kidney stones  She apparently had a bone density done in ?  2017 and she was told that she had osteoporosis, reportedly her T score was -2.9 but details are not available The patient herself went to a parathyroid clinic in [place] and had parathyroid surgery done on 11/29/15 This showed hyperplasia in all 4 glands and she had 3-1/2 glands removed, leaving the portion of right lower parathyroid in situ without transplantation  She thinks her calcium levels subsequently had been quite normal but reports are not available from PCP office However the patient started taking calcium supplements She also said that if she would have any physical symptoms such as aching or jerking in her legs she would take her calcium tablet and would feel better right away  In 2018 she was taking about 2000 mg of calcium supplements along with increased dietary supplementation Her calcium level done by gynecologist was 10.4 and she stopped calcium supplements She also was taking 2000 units of vitamin D3 which was also stopped  Follow-up calcium level done through her cardiologist in 12/18 was normal at 9.4 including ionized calcium  Also recent PTH level was normal at 21 in December from [employer] Results  Component Value Date   CALCIUM 9.4 09/03/2017    The hypercalcemia was not associated with any pathologic fractures, height loss, renal insufficiency, nephrolithiasis, sarcoidosis, known carcinoma, thyroid disease.   Prior serologic and  radiologic studies have included:  Bone density results not available, last done in 11/18  Lab Results  Component Value Date   CALCIUM 9.4 09/03/2017   CAION 5.2 09/05/2017       25 (OH) Vitamin D level is not available     Allergies as of 10/17/2017   No Known Allergies     Medication List        Accurate as of 10/17/17  9:17 PM. Always use your most recent med list.          benzonatate 200 MG capsule Commonly known as:  TESSALON Take 1 capsule by mouth daily as needed.   propranolol 10 MG tablet Commonly known as:  INDERAL Take 1 tablet (10 mg total) by mouth every 6 (six) hours as needed (fast heart rates).       Allergies: No Known Allergies  Past Medical History:  Diagnosis Date  . Paroxysmal SVT (supraventricular tachycardia) (HCC)     Past Surgical History:  Procedure Laterality Date  . BUNIONECTOMY     bilat 2001  . NECK SURGERY     1994 mass removed from  . PARATHYROIDECTOMY      Family History  Problem Relation Age of Onset  . Colon cancer Neg Hx   . Pancreatic cancer Neg Hx   . Rectal cancer Neg Hx   . Stomach cancer Neg Hx     Social History:  reports that  has never smoked. she has never used smokeless tobacco. She reports that she drinks about  0.6 oz of alcohol per week. She reports that she does not use drugs.  Review of Systems  Constitutional: Negative for weight loss.  HENT: Negative for hoarseness and trouble swallowing.   Respiratory: Negative for shortness of breath.   Cardiovascular: Positive for palpitations. Negative for chest pain.       She has had palpitations on and off since at least 2012, now treated with propranolol as needed and having less frequent symptoms.  Followed by Dr. Caryl Comes  Gastrointestinal: Negative for constipation.  Endocrine: Negative for fatigue.  Genitourinary: Negative for frequency.  Musculoskeletal: Negative for back pain.  Skin: Negative for rash.  Neurological: Negative for tingling.      EXAM:  BP (!) 98/58 (BP Location: Left Arm, Patient Position: Sitting, Cuff Size: Normal)   Ht 5\' 5"  (1.651 m)   Wt 131 lb 6.4 oz (59.6 kg)   LMP 11/09/2015   BMI 21.87 kg/m   GENERAL: Averagely built and nourished  No pallor, clubbing, lymphadenopathy or edema.  Skin:  no rash or pigmentation.  EYES:  Externally normal.  ENT: Oral mucosa and tongue normal.  THYROID:  Not palpable.   HEART:  Normal  S1 and S2; no murmur or click.  CHEST:  Normal shape Lungs:   Vescicular breath sounds heard equally.  No crepitations/ wheeze.  ABDOMEN:  No distention.  Liver and spleen not palpable.  No other mass or tenderness.  NEUROLOGICAL: .Reflexes are normal bilaterally at biceps.  SPINE AND JOINTS:  Normal.  Assessment/Plan:   History of HYPERPARATHYROIDISM secondary to parathyroid hyperplasia.  Patient has had 3-1/2 parathyroid glands removed in 2017 since she had osteoporosis diagnosis right after her menopause Reportedly her parathyroid surgery was successful and by history has had normal calcium levels subsequently She was concerned about her calcium being 10.4 and December 2018 but at that time she was taking large amounts of calcium supplements  Appears that the patient was taking excessive amounts of calcium without any previous history of persistent hypercalcemia after surgery and most likely her intake was acting as a placebo for various nonspecific physical symptoms  Calcium level was normal within 10 days after stopping calcium supplements Also parathyroid hormone level has been normal indicating no recurrence of her hyperparathyroidism No history of vitamin D deficiency  Discussed with the patient that we will recheck her calcium today and avoid excessive pressure with the tourniquet while driving the blood for more accurate results If calcium is high normal will recheck PTH Most likely she does not need to take any extra calcium supplements but just dietary  calcium which she has already increased Vitamin D level is being followed by PCP We will request reports of previous lab reports from PCP to review  OSTEOPOROSIS: Details are not available but likely that postmenopausal posterior processes was exacerbated by her hyperparathyroidism Will request reports of previous bone density results Discussed that if she has persistent osteoporosis then may be a candidate for bisphosphonate use also   Episodic atrial arrhythmias: Followed by cardiologist, explained to patient that this has no relationship to her calcium abnormalities in the past  Elayne Snare 10/17/2017, 9:17 PM   Consultation report sent to the referring physician

## 2017-10-18 LAB — BASIC METABOLIC PANEL
BUN: 17 mg/dL (ref 6–23)
CALCIUM: 9.7 mg/dL (ref 8.4–10.5)
CO2: 28 mEq/L (ref 19–32)
Chloride: 103 mEq/L (ref 96–112)
Creatinine, Ser: 0.71 mg/dL (ref 0.40–1.20)
GFR: 90.94 mL/min (ref >60.00)
GLUCOSE: 93 mg/dL (ref 70–99)
Potassium: 4.4 mEq/L (ref 3.5–5.1)
SODIUM: 139 meq/L (ref 135–145)

## 2017-11-01 ENCOUNTER — Other Ambulatory Visit: Payer: BLUE CROSS/BLUE SHIELD

## 2017-11-27 ENCOUNTER — Ambulatory Visit: Payer: BLUE CROSS/BLUE SHIELD | Admitting: Internal Medicine

## 2017-12-17 ENCOUNTER — Encounter: Payer: Self-pay | Admitting: Internal Medicine

## 2017-12-18 ENCOUNTER — Ambulatory Visit (INDEPENDENT_AMBULATORY_CARE_PROVIDER_SITE_OTHER)
Admission: RE | Admit: 2017-12-18 | Discharge: 2017-12-18 | Disposition: A | Discharge status: Home / Self Care | Payer: BLUE CROSS/BLUE SHIELD | Source: Ambulatory Visit | Attending: Internal Medicine | Admitting: Internal Medicine

## 2017-12-18 DIAGNOSIS — I471 Supraventricular tachycardia: Secondary | ICD-10-CM

## 2017-12-19 ENCOUNTER — Telehealth: Payer: Self-pay | Admitting: Internal Medicine

## 2017-12-19 NOTE — Telephone Encounter (Signed)
Spoke with patient in regards to EKG that she waiting to here explanation of results.

## 2017-12-19 NOTE — Telephone Encounter (Signed)
New Message  Pt calling to check on the ekg reading she sent, states it was the first time it read Afib. Please call

## 2017-12-28 IMAGING — CR DG CHEST 2V
2 series · 2 of 2 positions shown · non-contrast
Comparison: None.

CLINICAL DATA: Chronic cough for 1 year.

EXAM:
CHEST  2 VIEW

[w chest pa]
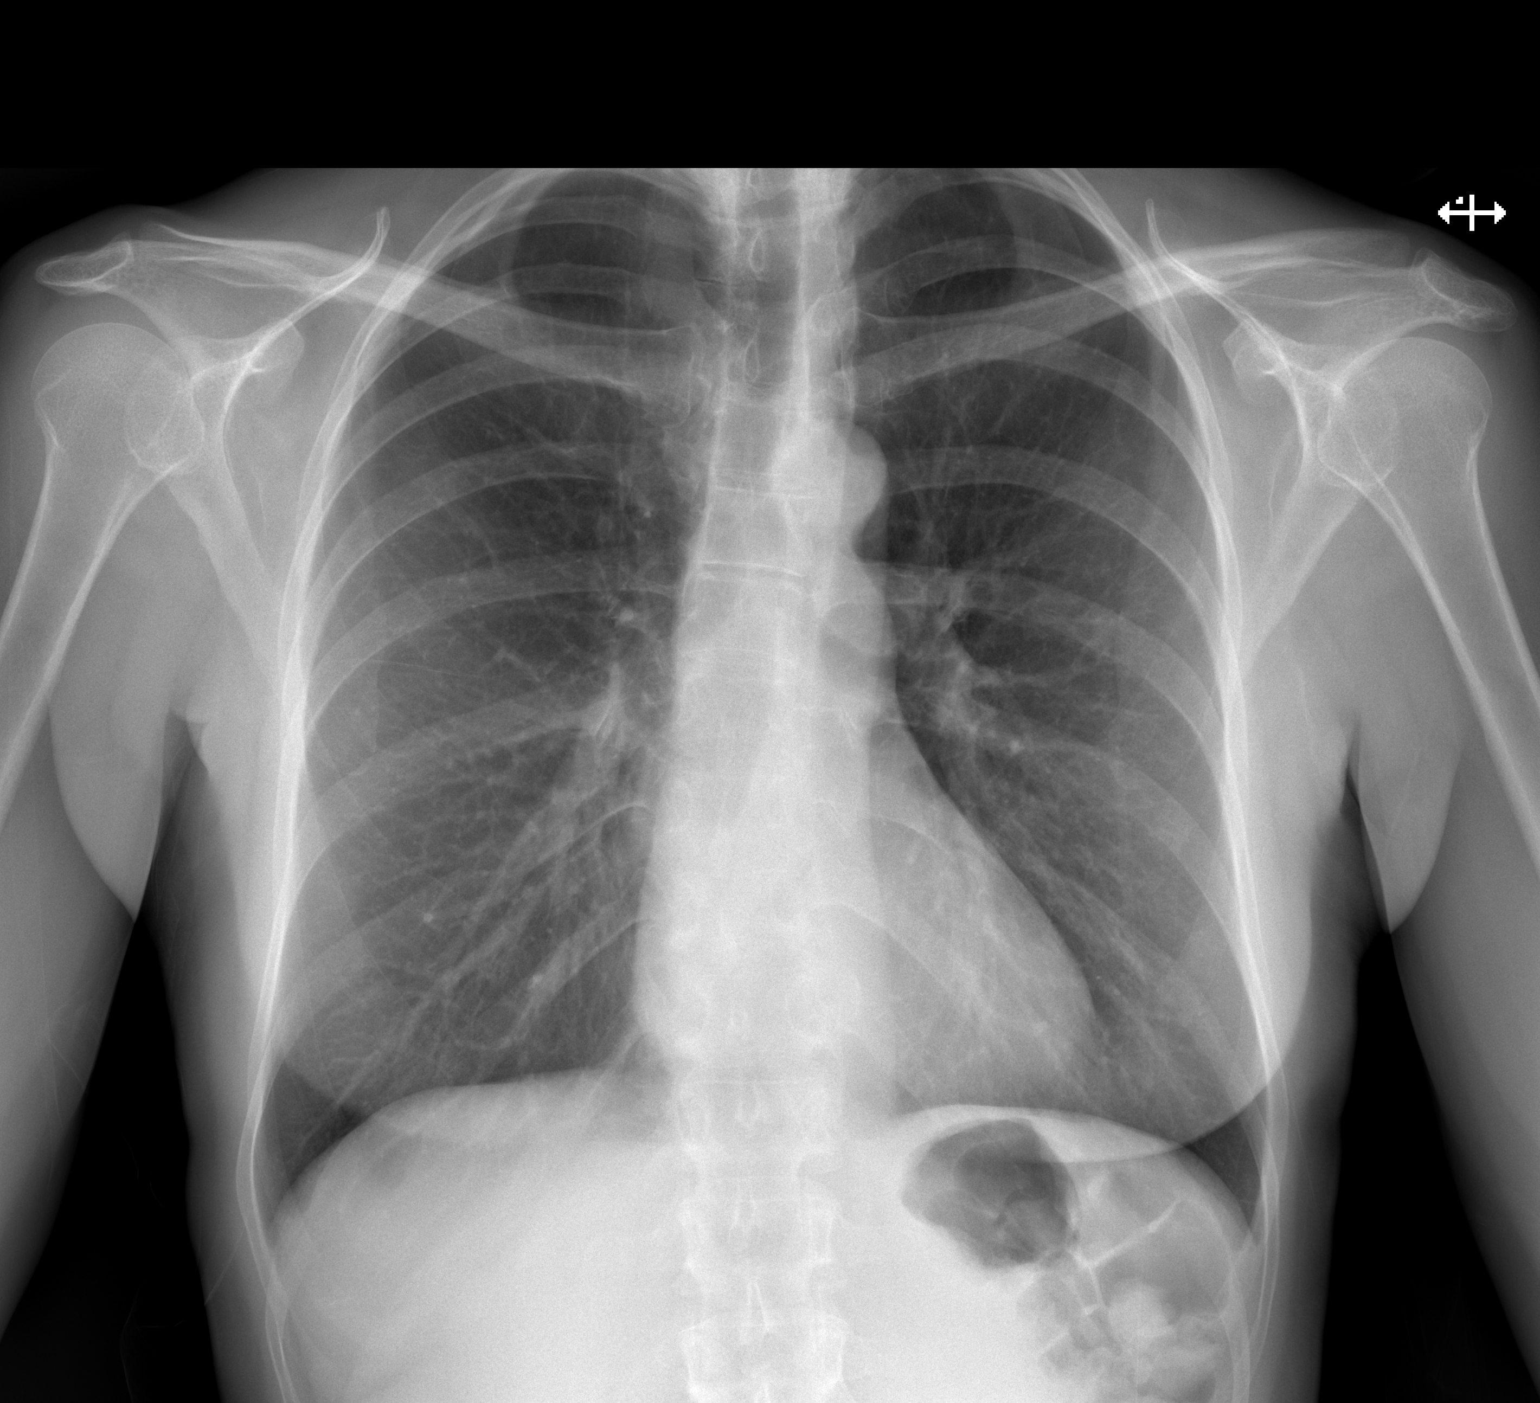

[w chest lat]
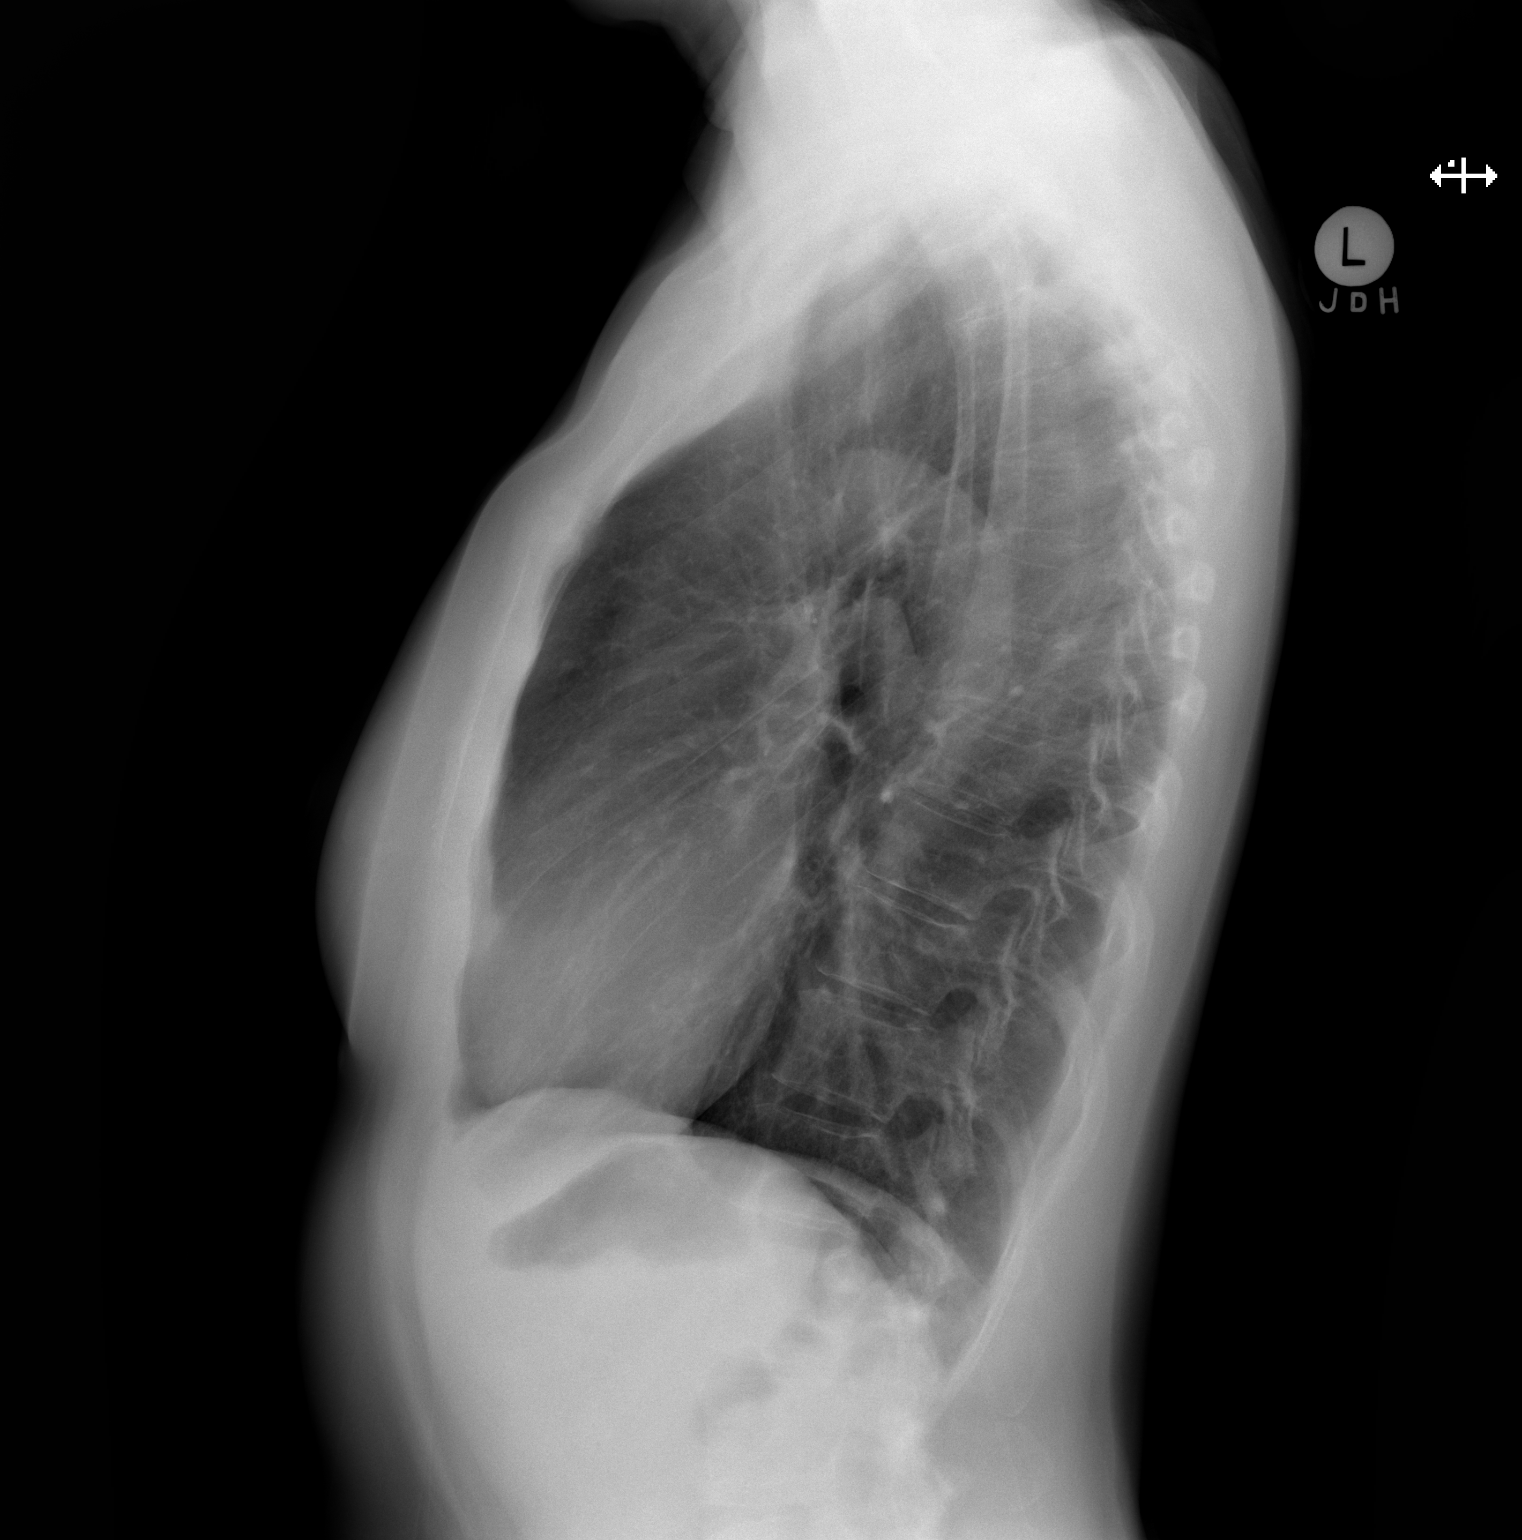

[2 of 2 positions shown; findings below may reference images not displayed]

FINDINGS: The cardiomediastinal silhouette is unremarkable.

There is no evidence of focal airspace disease, pulmonary edema,
suspicious pulmonary nodule/mass, pleural effusion, or pneumothorax.
No acute bony abnormalities are identified.
IMPRESSION: No active cardiopulmonary disease.

## 2018-01-02 ENCOUNTER — Telehealth: Payer: Self-pay

## 2018-01-02 NOTE — Telephone Encounter (Signed)
lmtcb for cardiac ct results

## 2018-01-02 NOTE — Telephone Encounter (Signed)
-----   Message from Deboraha Sprang, MD sent at 01/02/2018 10:21 AM EDT ----- Please Inform Patient that  Ca score was normal  Thanks

## 2018-01-02 NOTE — Telephone Encounter (Signed)
Pt is aware and agreeable to normal study. She will call in the beginning of June upon return from [place] to schedule follow up.

## 2018-02-13 ENCOUNTER — Encounter: Payer: Self-pay | Admitting: Internal Medicine

## 2018-02-13 ENCOUNTER — Ambulatory Visit: Payer: BLUE CROSS/BLUE SHIELD | Admitting: Internal Medicine

## 2018-02-13 VITALS — BP 108/80 | HR 80 | Ht 65.0 in | Wt 128.8 lb

## 2018-02-13 DIAGNOSIS — I471 Supraventricular tachycardia: Secondary | ICD-10-CM | POA: Diagnosis not present

## 2018-02-13 NOTE — Patient Instructions (Signed)

## 2018-02-13 NOTE — Progress Notes (Signed)
      Patient Care Team: Fanny Bien, MD as PCP - General (Family Medicine)   HPI  Priscilla Miller is a 57 y.o. female Seen in follow-up for tachypalpitations with a previously documented long RP tachycardia associate with gradual acceleration and without a P wave morphology distinct from sinus .  AliveCor monitor was recommended to make sure there is no palpitations consistent with atrial fibrillation  She used it.  There was no evidence of atrial fibrillation.  The episode of tachycardia was also a long RP.  She finds that she has been able to control her tachycardia by valsalva, cold exposure and she has not needed the as needed propranolol.  There were concerns about coronary disease in the context of her hyperparathyroidism.  Calcium scoring was 0  Records and Results Reviewed   Past Medical History:  Diagnosis Date  . Paroxysmal SVT (supraventricular tachycardia) (HCC)     Past Surgical History:  Procedure Laterality Date  . BUNIONECTOMY     bilat 2001  . NECK SURGERY     1994 mass removed from  . PARATHYROIDECTOMY      Current Meds  Medication Sig  . propranolol (INDERAL) 10 MG tablet Take 1 tablet (10 mg total) by mouth every 6 (six) hours as needed (fast heart rates).    No Known Allergies    Review of Systems negative except from HPI and PMH  Physical Exam BP 108/80   Pulse 80   Ht 5\' 5"  (1.651 m)   Wt 128 lb 12.8 oz (58.4 kg)   LMP 11/09/2015   SpO2 98%   BMI 21.43 kg/m  Well developed and nourished in no acute distress HENT normal Neck supple with JVP-flat Clear Regular rate and rhythm, no murmurs or gallops Abd-soft with active BS No Clubbing cyanosis edema Skin-warm and dry A & Oriented  Grossly normal sensory and motor function  Sinus @ 80  15/09/37   Assessment and  Plan Tachypalpitations  Long RP tachycardia with continuous acceleration  treated hyperparathyroidism (status post parathyroidectomy)  Anxiety   She  feels significantly empowered by the Alive Cor  NO significant arrhythmias are noted  I have encouraged her to use it liberally so as to gain clarity for her different palpitations   We spent more than 50% of our >25 min visit in face to face counseling regarding the above     Current medicines are reviewed at length with the patient today .  The patient does not  have concerns regarding medicines.

## 2019-02-21 ENCOUNTER — Encounter: Payer: Self-pay | Admitting: Gastroenterology

## 2019-04-25 ENCOUNTER — Encounter (HOSPITAL_COMMUNITY): Payer: Self-pay | Admitting: *Deleted

## 2019-04-25 ENCOUNTER — Emergency Department (HOSPITAL_COMMUNITY)
Admission: EM | Admit: 2019-04-25 | Discharge: 2019-04-25 | Disposition: A | Discharge status: Home / Self Care | Payer: BLUE CROSS/BLUE SHIELD | Attending: Emergency Medicine | Admitting: Emergency Medicine

## 2019-04-25 ENCOUNTER — Other Ambulatory Visit: Payer: Self-pay

## 2019-04-25 DIAGNOSIS — M79604 Pain in right leg: Secondary | ICD-10-CM

## 2019-04-25 DIAGNOSIS — R600 Localized edema: Secondary | ICD-10-CM | POA: Insufficient documentation

## 2019-04-25 DIAGNOSIS — M79661 Pain in right lower leg: Secondary | ICD-10-CM | POA: Diagnosis not present

## 2019-04-25 NOTE — Discharge Instructions (Addendum)
It is unclear at this time what is causing the swelling and pain in your leg.  You need to have a DVT (deep vein thrombosis) study performed.  This is done by ultrasound and can be performed tomorrow morning.  Please return to the ER if you have significant chest pain, shortness of breath, cough, or fever.  If your ultrasound is negative, you will need to follow-up with your doctor to look for other causes of leg pain/swelling.

## 2019-04-25 NOTE — ED Provider Notes (Signed)
[facility] EMERGENCY DEPARTMENT Provider Note   CSN: [id number] Arrival date & time: 04/25/19  1955     History   Chief Complaint Chief Complaint  Patient presents with  . Leg Pain    HPI Priscilla Miller is a 58 y.o. female.     Patient presents to the ED with a chief complaint of intermittent right lower extremity swelling and pain.  She states she has been having the symptoms for the past 2 months.  She had a telemedicine visit today, and was advised that she may need a DVT study of her leg.  She denies any redness or pain with palpation.  She does report history of osteoporosis, and states that it feels like the pain moves occasionally.  The pain is also intermittent.  She states that she walks daily.  She did recently travel to [place], but several breaks and got up and walked around.  She has no history of DVT or PE.  She denies any chest pain or shortness of breath.  Denies any fever, chills, cough.  The history is provided by the patient. No language interpreter was used.    Past Medical History:  Diagnosis Date  . Paroxysmal SVT (supraventricular tachycardia) [facility])     Patient Active Problem List   Diagnosis Date Noted  . Heart palpitations 05/23/2011  . Paroxysmal SVT (supraventricular tachycardia) ([facility]) 05/23/2011    Past Surgical History:  Procedure Laterality Date  . BUNIONECTOMY     bilat 2001  . NECK SURGERY     1994 mass removed from  . PARATHYROIDECTOMY       OB History   No obstetric history on file.      Home Medications    Prior to Admission medications   Medication Sig Start Date End Date Taking? Authorizing Provider  propranolol (INDERAL) 10 MG tablet Take 1 tablet (10 mg total) by mouth every 6 (six) hours as needed (fast heart rates). 09/05/17   Deboraha Sprang, MD    Family History Family History  Problem Relation Age of Onset  . Osteopenia Mother   . Colon cancer Neg Hx   . Pancreatic cancer Neg Hx   . Rectal  cancer Neg Hx   . Stomach cancer Neg Hx     Social History Social History   Tobacco Use  . Smoking status: Never Smoker  . Smokeless tobacco: Never Used  Substance Use Topics  . Alcohol use: Yes    Alcohol/week: 1.0 standard drinks    Types: 1 Cans of beer per week  . Drug use: No     Allergies   Patient has no known allergies.   Review of Systems Review of Systems  All other systems reviewed and are negative.    Physical Exam Updated Vital Signs BP 116/62 (BP Location: Right Arm)   Pulse 65   Temp 98.2 F (36.8 C) (Oral)   Resp 19   Ht 5\' 5"  (1.651 m)   Wt 63.5 kg   LMP 11/09/2015   SpO2 100%   BMI 23.30 kg/m   Physical Exam Vitals signs and nursing note reviewed.  Constitutional:      General: She is not in acute distress.    Appearance: She is well-developed.  HENT:     Head: Normocephalic and atraumatic.  Eyes:     Conjunctiva/sclera: Conjunctivae normal.  Neck:     Musculoskeletal: Neck supple.  Cardiovascular:     Rate and Rhythm: Normal rate and regular rhythm.  Heart sounds: No murmur.  Pulmonary:     Effort: Pulmonary effort is normal. No respiratory distress.     Breath sounds: Normal breath sounds. No wheezing.  Abdominal:     General: There is no distension.  Musculoskeletal: Normal range of motion.     Comments: Moves all extremities No tenderness to palpation, no swelling, no redness, no deformity, no palpable venous cords  Skin:    General: Skin is warm and dry.  Neurological:     Mental Status: She is alert and oriented to person, place, and time.  Psychiatric:        Mood and Affect: Mood normal.        Behavior: Behavior normal.      ED Treatments / Results  Labs (all labs ordered are listed, but only abnormal results are displayed) Labs Reviewed - No data to display  EKG None  Radiology No results found.  Procedures Procedures (including critical care time)  Medications Ordered in ED Medications - No data  to display   Initial Impression / Assessment and Plan / ED Course  I have reviewed the triage vital signs and the nursing notes.  Pertinent labs & imaging results that were available during my care of the patient were reviewed by me and considered in my medical decision making (see chart for details).        Patient here for DVT study.  Denies any chest pain or shortness of breath.  O2 saturation is normal.  She has no history of PE or DVT.  Due to the hour, we do not have ultrasound.  Will order outpatient study for tomorrow.  Patient understands agrees with plan.  Return precautions given.  Final Clinical Impressions(s) / ED Diagnoses   Final diagnoses:  Right leg pain    ED Discharge Orders         Ordered    VAS [place] LOWER EXTREMITY VENOUS (DVT)  Status:  Canceled     04/25/19 2230    LE VENOUS     04/25/19 2233           Montine Circle, PA-C 04/25/19 2238    Noemi Chapel, MD 04/26/19 [phone]

## 2019-04-25 NOTE — ED Triage Notes (Signed)
The pt has had rt lower leg pain since June  Gets better then worse no swelling  No injury

## 2019-04-25 NOTE — ED Notes (Signed)
Patient verbalizes understanding of discharge instructions. Opportunity for questioning and answers were provided. Armband removed by staff, pt discharged from ED ambulatory.   

## 2019-04-26 ENCOUNTER — Ambulatory Visit (HOSPITAL_COMMUNITY)
Admission: RE | Admit: 2019-04-26 | Discharge: 2019-04-26 | Disposition: A | Discharge status: Home / Self Care | Payer: BLUE CROSS/BLUE SHIELD | Source: Ambulatory Visit | Attending: Emergency Medicine | Admitting: Emergency Medicine

## 2019-04-26 DIAGNOSIS — M79609 Pain in unspecified limb: Secondary | ICD-10-CM | POA: Diagnosis not present

## 2019-04-26 DIAGNOSIS — M79606 Pain in leg, unspecified: Secondary | ICD-10-CM | POA: Diagnosis not present

## 2019-04-26 DIAGNOSIS — M7989 Other specified soft tissue disorders: Secondary | ICD-10-CM | POA: Diagnosis not present

## 2019-04-26 NOTE — Progress Notes (Signed)
VASCULAR LAB PRELIMINARY  PRELIMINARY  PRELIMINARY  PRELIMINARY  Right lower extremity venous duplex completed.    Preliminary report:  See CV proc for preliminary results.   Cailee Blanke, RVT 04/26/2019, 11:35 AM
